# Patient Record
Sex: Male | Born: 1995 | Race: Black or African American | Hispanic: No | Marital: Single | Smoking: Former smoker
Health system: Southern US, Community
[De-identification: ages and names within clinical notes are randomized; demographics above are authoritative.]

---

## 2019-10-22 ENCOUNTER — Encounter (HOSPITAL_BASED_OUTPATIENT_CLINIC_OR_DEPARTMENT_OTHER): Payer: Self-pay

## 2019-10-22 ENCOUNTER — Emergency Department (HOSPITAL_BASED_OUTPATIENT_CLINIC_OR_DEPARTMENT_OTHER)
Admission: EM | Admit: 2019-10-22 | Discharge: 2019-10-22 | Disposition: A | Discharge status: Home / Self Care | Payer: Self-pay | Attending: Emergency Medicine | Admitting: Emergency Medicine

## 2019-10-22 ENCOUNTER — Emergency Department (HOSPITAL_BASED_OUTPATIENT_CLINIC_OR_DEPARTMENT_OTHER): Payer: Self-pay

## 2019-10-22 ENCOUNTER — Other Ambulatory Visit: Payer: Self-pay

## 2019-10-22 DIAGNOSIS — R21 Rash and other nonspecific skin eruption: Secondary | ICD-10-CM | POA: Insufficient documentation

## 2019-10-22 DIAGNOSIS — M7989 Other specified soft tissue disorders: Secondary | ICD-10-CM | POA: Insufficient documentation

## 2019-10-22 DIAGNOSIS — Z5321 Procedure and treatment not carried out due to patient leaving prior to being seen by health care provider: Secondary | ICD-10-CM | POA: Insufficient documentation

## 2019-10-22 NOTE — ED Triage Notes (Signed)
PT c/o burning, itching to his testicles. Pt reports bilateral swelling. Denies penile discharge or pubic lesions.

## 2019-10-22 NOTE — ED Notes (Signed)
Upon reassessing pt's V/S he stated he was tired and just going to go home and go to bed.

## 2021-03-17 IMAGING — US US SCROTUM
1 series · 14 of 25 positions shown · non-contrast
Comparison: None.

CLINICAL DATA: Itching, burning and swelling x4 days.

EXAM:
SCROTAL ULTRASOUND
DOPPLER ULTRASOUND OF THE TESTICLES
TECHNIQUE: Complete ultrasound examination of the testicles, epididymis, and
other scrotal structures was performed. Color and spectral Doppler
ultrasound were also utilized to evaluate blood flow to the
testicles.

[Series 1: us scrotum · 14 of 61 slices shown]
[im 1/61]
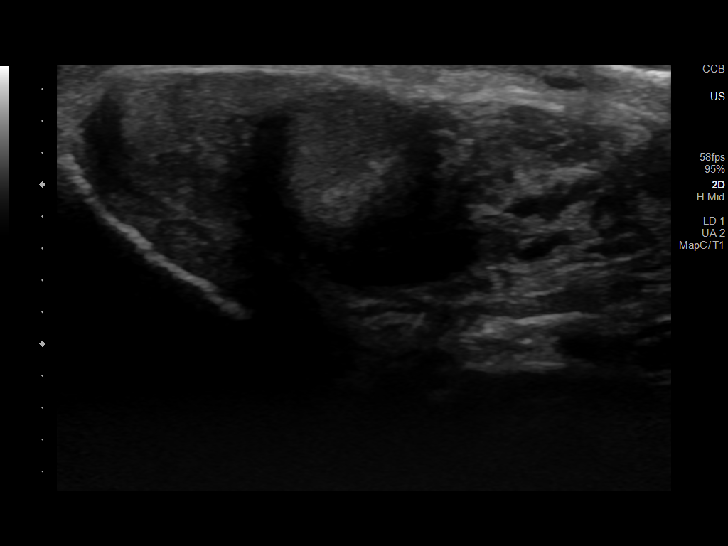
[im 6/61]
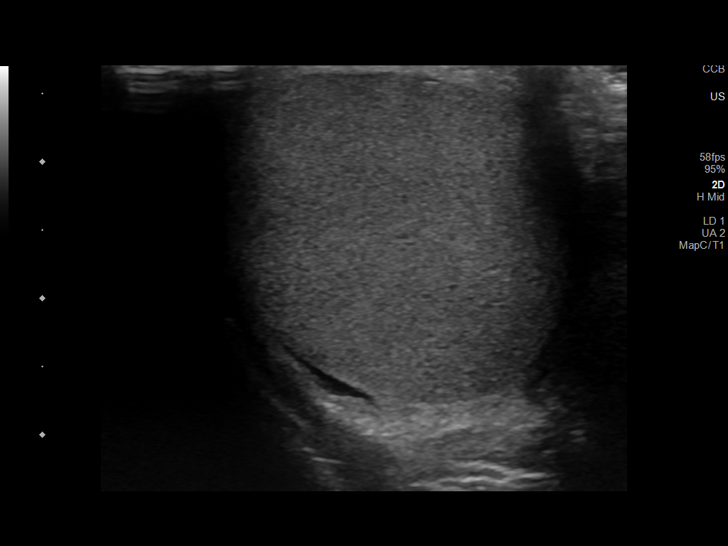
[im 11/61]
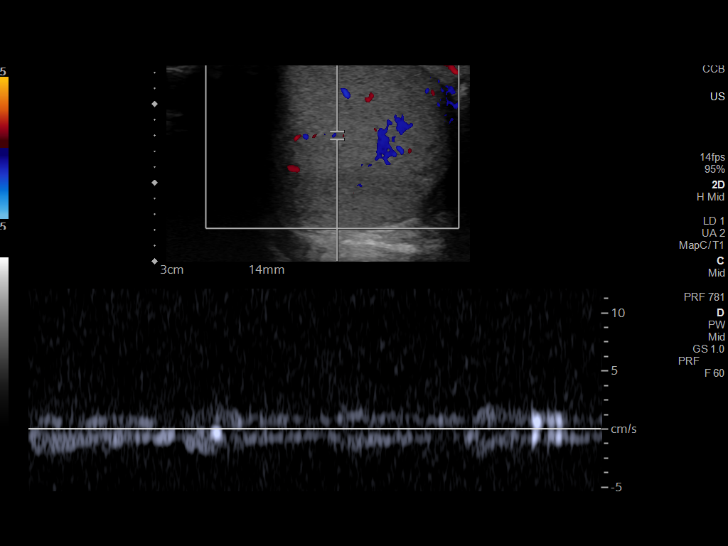
[im 16/61]
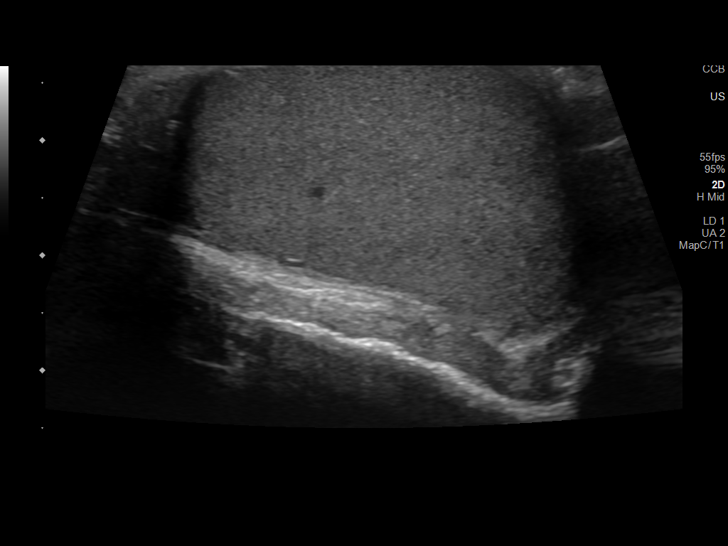
[im 21/61]
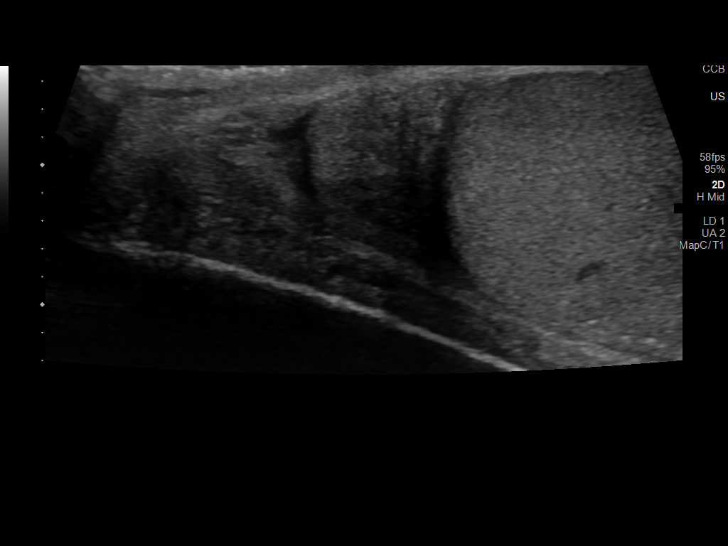
[im 23/61]
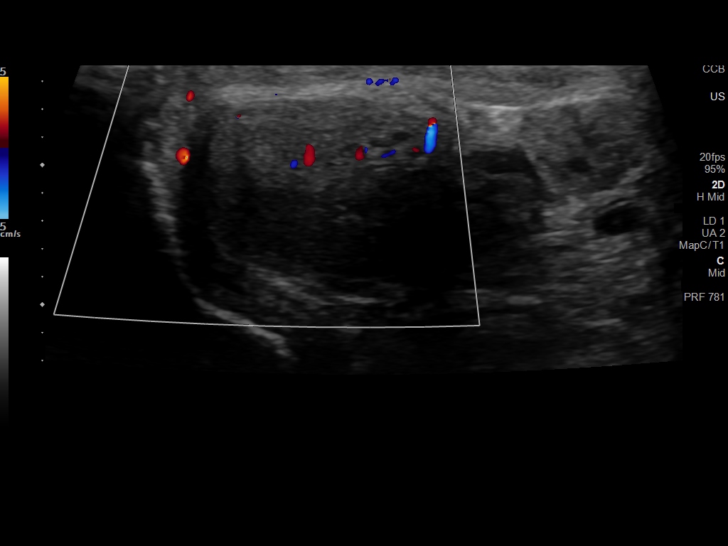
[im 28/61]
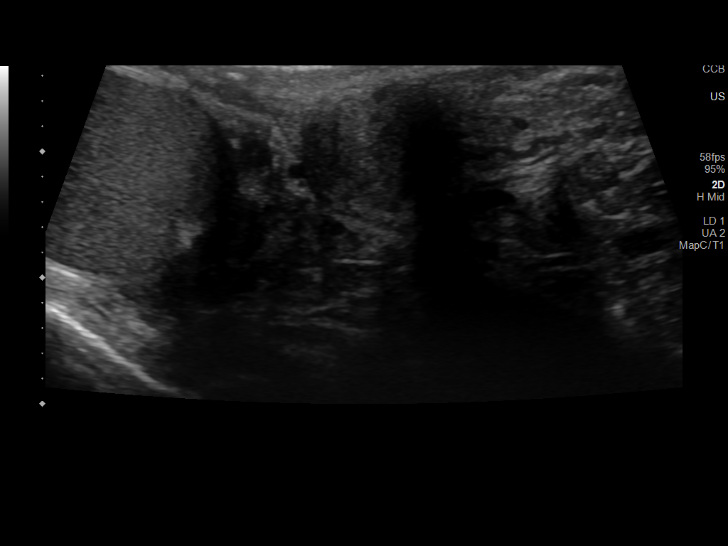
[im 33/61]
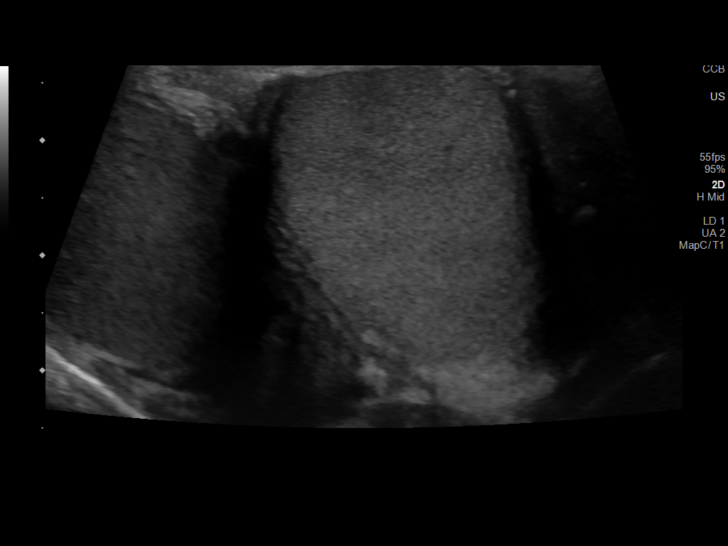
[im 38/61]
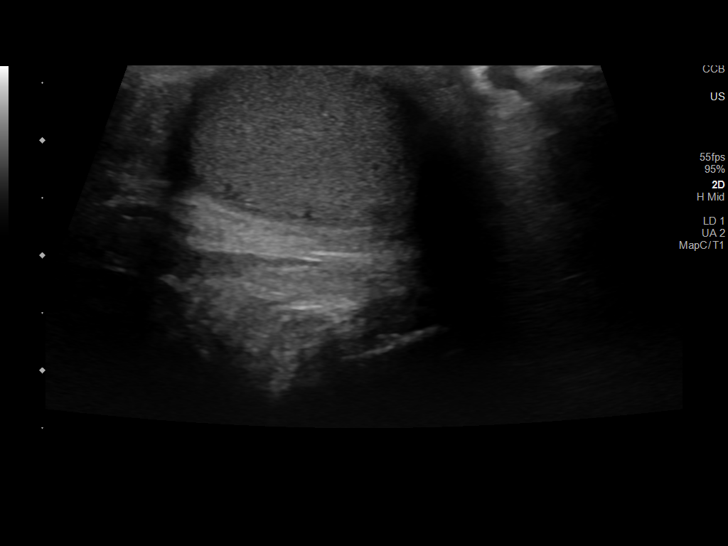
[im 41/61]
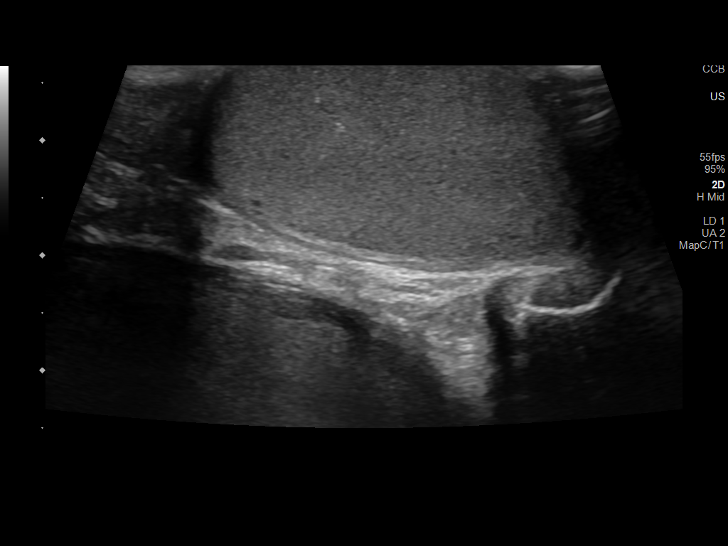
[im 46/61]
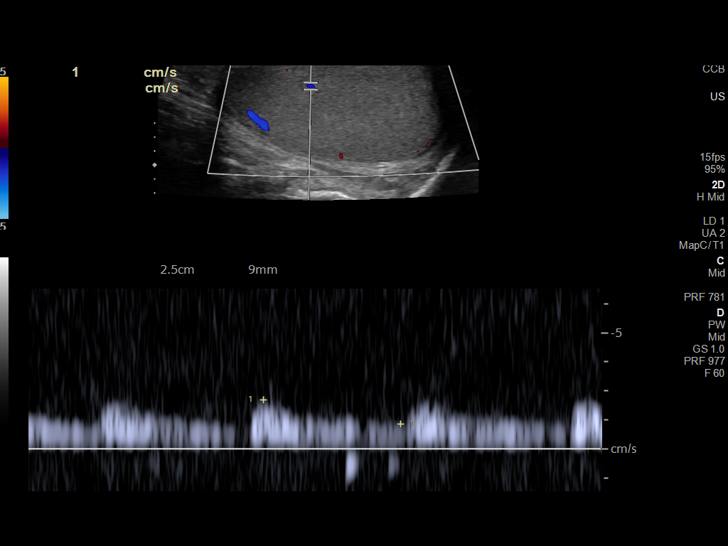
[im 51/61]
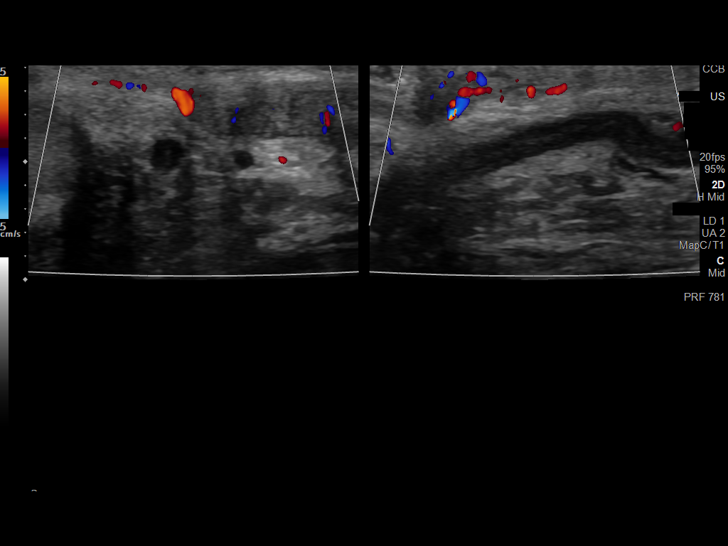
[im 56/61]
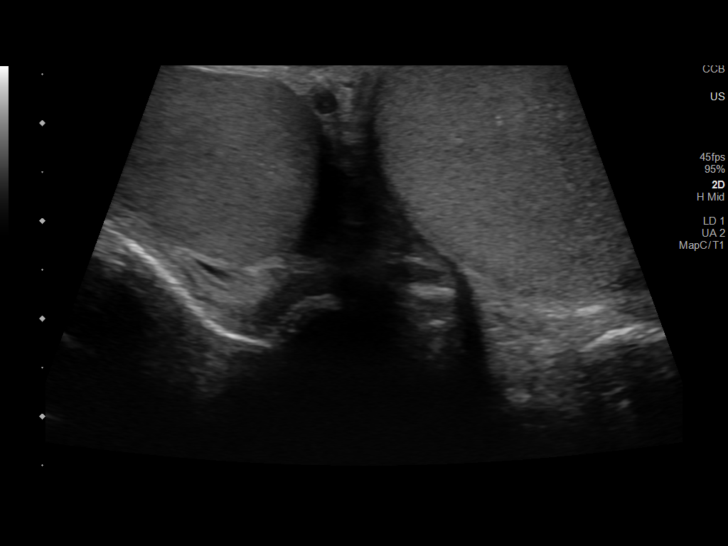
[im 61/61]
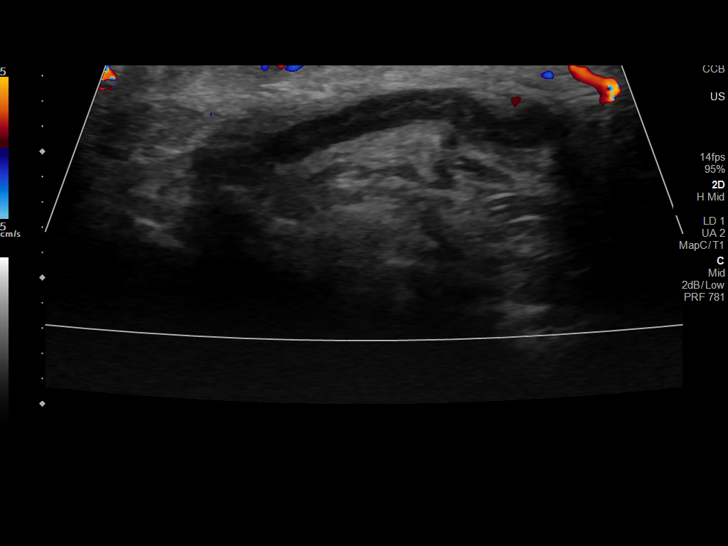

[14 of 25 positions shown; findings below may reference images not displayed]

FINDINGS: Right testicle

Measurements: 3.5 cm x 2.6 cm x 2.0 cm. No mass or microlithiasis
visualized.

Left testicle

Measurements: 3.1 cm x 2.8 cm x 2.0 cm. No mass or microlithiasis
visualized.

Right epididymis:  Normal in size and heterogeneous in appearance.

Left epididymis:  Normal in size and heterogeneous in appearance.

Hydrocele:  A very small right-sided hydrocele is seen.

Varicocele:  Bilateral varicoceles are seen.

Pulsed Doppler interrogation of both testes demonstrates normal low
resistance arterial and venous waveforms bilaterally.

A tubular-appearing hypoechoic structure is seen in between the
testicles.
IMPRESSION: 1. Bilateral varicoceles.
2. Very small right-sided hydrocele.
